# Patient Record
Sex: Female | Born: 1935 | Race: White | Hispanic: No | Marital: Single | Smoking: Former smoker
Health system: Southern US, Community
[De-identification: ages and names within clinical notes are randomized; demographics above are authoritative.]

## PROBLEM LIST (undated history)

## (undated) DIAGNOSIS — G43909 Migraine, unspecified, not intractable, without status migrainosus: Secondary | ICD-10-CM

## (undated) DIAGNOSIS — Z8709 Personal history of other diseases of the respiratory system: Secondary | ICD-10-CM

## (undated) DIAGNOSIS — R011 Cardiac murmur, unspecified: Secondary | ICD-10-CM

## (undated) DIAGNOSIS — E7800 Pure hypercholesterolemia, unspecified: Secondary | ICD-10-CM

## (undated) DIAGNOSIS — I10 Essential (primary) hypertension: Secondary | ICD-10-CM

## (undated) DIAGNOSIS — S72002A Fracture of unspecified part of neck of left femur, initial encounter for closed fracture: Secondary | ICD-10-CM

## (undated) DIAGNOSIS — Z8639 Personal history of other endocrine, nutritional and metabolic disease: Secondary | ICD-10-CM

## (undated) DIAGNOSIS — Z8719 Personal history of other diseases of the digestive system: Secondary | ICD-10-CM

## (undated) HISTORY — DX: Personal history of other diseases of the respiratory system: Z87.09

## (undated) HISTORY — DX: Essential (primary) hypertension: I10

## (undated) HISTORY — PX: APPENDECTOMY: SHX54

## (undated) HISTORY — PX: OTHER SURGICAL HISTORY: SHX169

## (undated) HISTORY — DX: Personal history of other diseases of the digestive system: Z87.19

## (undated) HISTORY — DX: Personal history of other endocrine, nutritional and metabolic disease: Z86.39

## (undated) HISTORY — PX: CHOLECYSTECTOMY: SHX55

## (undated) HISTORY — PX: TONSILLECTOMY: SUR1361

## (undated) HISTORY — PX: LEG SURGERY: SHX1003

## (undated) HISTORY — PX: EYE SURGERY: SHX253

## (undated) HISTORY — DX: Fracture of unspecified part of neck of left femur, initial encounter for closed fracture: S72.002A

---

## 1966-06-25 HISTORY — PX: THORACOTOMY: SHX5074

## 1986-06-25 HISTORY — PX: HAND SURGERY: SHX662

## 1999-06-15 ENCOUNTER — Other Ambulatory Visit: Admission: RE | Admit: 1999-06-15 | Discharge: 1999-06-15 | Payer: Self-pay | Admitting: Internal Medicine

## 2000-06-14 ENCOUNTER — Other Ambulatory Visit: Admission: RE | Admit: 2000-06-14 | Discharge: 2000-06-14 | Payer: Self-pay | Admitting: Internal Medicine

## 2001-07-05 ENCOUNTER — Other Ambulatory Visit: Admission: RE | Admit: 2001-07-05 | Discharge: 2001-07-05 | Payer: Self-pay | Admitting: Internal Medicine

## 2002-06-30 ENCOUNTER — Other Ambulatory Visit: Admission: RE | Admit: 2002-06-30 | Discharge: 2002-06-30 | Payer: Self-pay | Admitting: Internal Medicine

## 2003-07-06 ENCOUNTER — Other Ambulatory Visit: Admission: RE | Admit: 2003-07-06 | Discharge: 2003-07-06 | Payer: Self-pay | Admitting: Internal Medicine

## 2003-07-06 ENCOUNTER — Encounter: Admission: RE | Admit: 2003-07-06 | Discharge: 2003-07-06 | Payer: Self-pay | Admitting: Internal Medicine

## 2003-07-27 ENCOUNTER — Encounter: Admission: RE | Admit: 2003-07-27 | Discharge: 2003-07-27 | Payer: Self-pay | Admitting: Internal Medicine

## 2011-11-29 ENCOUNTER — Emergency Department (HOSPITAL_BASED_OUTPATIENT_CLINIC_OR_DEPARTMENT_OTHER): Payer: Medicare Other

## 2011-11-29 ENCOUNTER — Encounter (HOSPITAL_BASED_OUTPATIENT_CLINIC_OR_DEPARTMENT_OTHER): Payer: Self-pay | Admitting: *Deleted

## 2011-11-29 ENCOUNTER — Emergency Department (HOSPITAL_BASED_OUTPATIENT_CLINIC_OR_DEPARTMENT_OTHER)
Admission: EM | Admit: 2011-11-29 | Discharge: 2011-11-30 | Disposition: A | Discharge status: Home / Self Care | Payer: Medicare Other | Attending: Emergency Medicine | Admitting: Emergency Medicine

## 2011-11-29 DIAGNOSIS — G43909 Migraine, unspecified, not intractable, without status migrainosus: Secondary | ICD-10-CM | POA: Insufficient documentation

## 2011-11-29 DIAGNOSIS — E7800 Pure hypercholesterolemia, unspecified: Secondary | ICD-10-CM | POA: Insufficient documentation

## 2011-11-29 DIAGNOSIS — Z79899 Other long term (current) drug therapy: Secondary | ICD-10-CM | POA: Insufficient documentation

## 2011-11-29 DIAGNOSIS — S42009A Fracture of unspecified part of unspecified clavicle, initial encounter for closed fracture: Secondary | ICD-10-CM

## 2011-11-29 DIAGNOSIS — S42402A Unspecified fracture of lower end of left humerus, initial encounter for closed fracture: Secondary | ICD-10-CM

## 2011-11-29 DIAGNOSIS — W07XXXA Fall from chair, initial encounter: Secondary | ICD-10-CM | POA: Insufficient documentation

## 2011-11-29 DIAGNOSIS — S42033A Displaced fracture of lateral end of unspecified clavicle, initial encounter for closed fracture: Secondary | ICD-10-CM | POA: Insufficient documentation

## 2011-11-29 DIAGNOSIS — F17200 Nicotine dependence, unspecified, uncomplicated: Secondary | ICD-10-CM | POA: Insufficient documentation

## 2011-11-29 DIAGNOSIS — S42409A Unspecified fracture of lower end of unspecified humerus, initial encounter for closed fracture: Secondary | ICD-10-CM | POA: Insufficient documentation

## 2011-11-29 HISTORY — DX: Pure hypercholesterolemia, unspecified: E78.00

## 2011-11-29 HISTORY — DX: Migraine, unspecified, not intractable, without status migrainosus: G43.909

## 2011-11-29 MED ORDER — OXYCODONE-ACETAMINOPHEN 5-325 MG PO TABS
2.0000 | ORAL_TABLET | Freq: Once | ORAL | Status: AC
  Administered 2011-11-29: 2 via ORAL
  Filled 2011-11-29: qty 2

## 2011-11-29 NOTE — ED Notes (Signed)
Larey Seat off a 2 step stool. Hit a cabinet with the left side of her neck and left elbow. EMS was called to the scene but pts family drove her here.

## 2011-11-30 ENCOUNTER — Other Ambulatory Visit: Payer: Self-pay | Admitting: Orthopedic Surgery

## 2011-11-30 MED ORDER — OXYCODONE-ACETAMINOPHEN 5-325 MG PO TABS: 1.0000 | ORAL_TABLET | Freq: Four times a day (QID) | ORAL | Status: AC | PRN

## 2011-11-30 NOTE — ED Notes (Signed)
Splinting applied; patient reports pain in the shoulder area; none on the rest of her arm.  Patient able to move fingers and cap refill present pre and post splinting.

## 2011-11-30 NOTE — Discharge Instructions (Signed)
Cast or Splint Care Casts and splints support injured limbs and keep bones from moving while they heal.  HOME CARE  Keep the cast or splint uncovered during the drying period.   A plaster cast can take 24 to 48 hours to dry.   A fiberglass cast will dry in less than 1 hour.   Do not rest the cast on anything harder than a pillow for 24 hours.   Do not put weight on your injured limb. Do not put pressure on the cast. Wait for your doctor's approval.   Keep the cast or splint dry.   Cover the cast or splint with a plastic bag during baths or wet weather.   If you have a cast over your chest and belly (trunk), take sponge baths until the cast is taken off.   Keep your cast or splint clean. Wash a dirty cast with a damp cloth.   Do not put any objects under your cast or splint. Do not scratch the skin under the cast with an object.   Do not take out the padding from inside your cast.   Exercise your joints near the cast as told by your doctor.   Raise (elevate) your injured limb on 1 or 2 pillows for the first 1 to 3 days.  GET HELP RIGHT AWAY IF:  Your cast or splint cracks.   Your cast or splint is too tight or too loose.   You itch badly under the cast.   Your cast gets wet or has a soft spot.   You have a bad smell coming from the cast.   You get an object stuck under the cast.   Your skin around the cast becomes red or raw.   You have new or more pain after the cast is put on.   You have fluid leaking through the cast.   You cannot move your fingers or toes.   Your fingers or toes turn colors or are cool, painful, or puffy (swollen).   You have tingling or lose feeling (numbness) around the injured area.   You have pain or pressure under the cast.   You have trouble breathing or have shortness of breath.   You have chest pain.  MAKE SURE YOU:  Understand these instructions.   Will watch your condition.   Will get help right away if you are not doing  well or get worse.  Document Released: 10/11/2010 Document Revised: 05/31/2011 Document Reviewed: 10/11/2010 [facility] Patient Information 2012 [place], [place].Clavicle Fracture A clavicle fracture is a break in the collarbone. This is a common injury, especially in children. Collarbones do not harden until around the age of 57. Most collarbone fractures are treated with a simple arm sling. In some cases a figure-of-eight splint is used to help hold the broken bones in position. Although not often needed, surgery may be required if the bone fragments are not in the correct position (displaced).  HOME CARE INSTRUCTIONS   Apply ice to the injury for 15 to 20 minutes each hour while awake for 2 days. Put the ice in a plastic bag and place a towel between the bag of ice and your skin.   Wear the sling or splint constantly for as long as directed by your caregiver. You may remove the sling or splint for bathing or showering. Be sure to keep your shoulder in the same place as when the sling or splint is on. Do not lift your arm.  If a figure-of-eight splint is applied, it must be tightened by another person every day. Tighten it enough to keep the shoulders held back. Allow enough room to place the index finger between the body and strap. Loosen the splint immediately if you feel numbness or tingling in your hands.   Only take over-the-counter or prescription medicines for pain, discomfort, or fever as directed by your caregiver.   Avoid activities that irritate or increase the pain for 4 to 6 weeks after surgery.   Follow all instructions for follow-up with your caregiver. This includes any referrals, physical therapy, and rehabilitation. Any delay in obtaining necessary care could result in a delay or failure of the injury to heal properly.  SEEK MEDICAL CARE IF:  You have pain and swelling that are not relieved with medications. SEEK IMMEDIATE MEDICAL CARE IF:  Your arm is numb, cold, or pale, even  when the splint is loose. MAKE SURE YOU:   Understand these instructions.   Will watch your condition.   Will get help right away if you are not doing well or get worse.  Document Released: 03/21/2005 Document Revised: 05/31/2011 Document Reviewed: 01/15/2008 [facility] Patient Information 2012 [place], [place].

## 2011-11-30 NOTE — ED Provider Notes (Signed)
History     CSN: [id number]  Arrival date & time 11/29/11  2200   First MD Initiated Contact with Patient 11/29/11 2303      Chief Complaint  Patient presents with  . Fall    (Consider location/radiation/quality/duration/timing/severity/associated sxs/prior treatment) Patient is a 76 y.o. female presenting with fall. The history is provided by the patient. No language interpreter was used.  Fall The accident occurred 1 to 2 hours ago. The fall occurred from a stool. She fell from a height of 1 to 2 ft. Impact surface: cabinet. There was no blood loss. The point of impact was the left shoulder and left elbow. The pain is present in the left shoulder and left elbow. The pain is at a severity of 10/10. The pain is severe. She was ambulatory at the scene. There was no entrapment after the fall. There was no drug use involved in the accident. There was no alcohol use involved in the accident. Pertinent negatives include no visual change, no numbness, no abdominal pain, no bowel incontinence, no nausea, no vomiting, no headaches, no loss of consciousness and no tingling. The symptoms are aggravated by activity. Prehospitalization: none. She has tried nothing for the symptoms. The treatment provided no relief.  Did not strike head, no LOC.  No lower exteremity or back pain.    Past Medical History  Diagnosis Date  . Migraine   . High cholesterol     Past Surgical History  Procedure Date  . Leg surgery   . Cholecystectomy   . Thoracotomy   . Hand surgery   . Appendectomy   . Tonsillectomy     No family history on file.  History  Substance Use Topics  . Smoking status: Current Everyday Smoker -- 1.0 packs/day  . Smokeless tobacco: Not on file  . Alcohol Use: No    OB History    Grav Para Term Preterm Abortions TAB SAB Ect Mult Living                  Review of Systems  Gastrointestinal: Negative for nausea, vomiting, abdominal pain and bowel incontinence.  Musculoskeletal:  Positive for arthralgias.  Neurological: Negative for tingling, loss of consciousness, numbness and headaches.  All other systems reviewed and are negative.    Allergies  Review of patient's allergies indicates no known allergies.  Home Medications   Current Outpatient Rx  Name Route Sig Dispense Refill  . BUTALBITAL-APAP-CAFFEINE 50-325-40 MG PO TABS Oral Take 1 tablet by mouth 2 (two) times daily as needed. For migraines    . NAPROXEN SODIUM 220 MG PO TABS Oral Take 440 mg by mouth 2 (two) times daily as needed. For pain    . NORTRIPTYLINE HCL 10 MG PO CAPS Oral Take 40 mg by mouth at bedtime.    Marland Kitchen SIMVASTATIN 20 MG PO TABS Oral Take 20 mg by mouth every evening.    Marland Kitchen VITAMIN D (ERGOCALCIFEROL) PO Oral Take 1 tablet by mouth daily.    Marland Kitchen ZOLMITRIPTAN 5 MG PO TBDP Oral Take 5 mg by mouth as needed. For migraine, may repeat in 2 hours if needed      There were no vitals taken for this visit.  Physical Exam  Constitutional: She is oriented to person, place, and time. She appears well-developed and well-nourished. No distress.  HENT:  Head: Normocephalic and atraumatic.  Right Ear: No mastoid tenderness. No hemotympanum.  Left Ear: No mastoid tenderness. No hemotympanum.  Mouth/Throat: Oropharynx is clear and  moist.  Eyes: Conjunctivae and EOM are normal. Pupils are equal, round, and reactive to light.  Neck: Normal range of motion. Neck supple.  Cardiovascular: Normal rate and regular rhythm.   Pulmonary/Chest: Effort normal and breath sounds normal. She has no wheezes. She has no rales.  Abdominal: Soft. Bowel sounds are normal. There is no tenderness. There is no rebound and no guarding.  Musculoskeletal: Normal range of motion.       Left hand neurovascularly intact no snuff box tenderness of the left wrist.  Cap refill to the fingers of the left hand < 2 sec.  Negative anterior and posterior drawer tests of B knees.  No step offs or crepitance of the spine intact L5/s1 intact  perineal sensation  Lymphadenopathy:    She has no cervical adenopathy.  Neurological: She is alert and oriented to person, place, and time. She has normal reflexes. GCS eye subscore is 4. GCS verbal subscore is 5. GCS motor subscore is 6.  Skin: Skin is warm and dry.  Psychiatric: She has a normal mood and affect.    ED Course  Procedures (including critical care time)  Labs Reviewed - No data to display Dg Chest 2 View  11/30/2011  *RADIOLOGY REPORT*  Clinical Data: Larey Seat.  Chest pain.  CHEST - 2 VIEW  Comparison: None available  Findings: The cardiac silhouette, mediastinal and hilar contours are within normal limits.  There are chronic-appearing bronchitic type scarring changes but no definite acute overlying pulmonary process.  No pneumothorax or pleural effusion.  The left distal clavicle fractures noted.  No obvious rib fractures.  The thoracic vertebral bodies are normally aligned.  IMPRESSION:  1.  Chronic appearing scarring type changes without acute overlying pulmonary process. 2.  Distal clavicle fracture on the left.  Original Report Authenticated By: P. Loralie Champagne, M.D.   Dg Cervical Spine Complete  11/30/2011  *RADIOLOGY REPORT*  Clinical Data: Larey Seat.  Neck pain.  CERVICAL SPINE - COMPLETE 4+ VIEW  Comparison: None  Findings: The lateral film demonstrates normal alignment of the cervical vertebral bodies.  Disc spaces and vertebral bodies are maintained.  No acute bony findings or abnormal prevertebral soft tissue swelling.  The oblique films demonstrate normally aligned articular facets and patent neural foramen.  The C1-C2 articulations are maintained. The lung apices are clear.  IMPRESSION: Normal alignment and no acute bony findings.  Original Report Authenticated By: P. Loralie Champagne, M.D.   Dg Clavicle Left  11/29/2011  *RADIOLOGY REPORT*  Clinical Data: History of fall complaining of left shoulder pain.  LEFT CLAVICLE - 2+ VIEWS  Comparison: No priors.  Findings: Two views of  the left clavicle demonstrate a mildly comminuted fracture of the distal third of the left clavicle.  The acromioclavicular joint appears intact.  IMPRESSION: 1. Mildly comminuted fracture of the distal third of the left clavicle.  Original Report Authenticated By: Florencia Reasons, M.D.   Dg Scapula Left  11/30/2011  *RADIOLOGY REPORT*  Clinical Data: Larey Seat.  Left shoulder pain.  LEFT SCAPULA - 2+ VIEWS  Comparison: None  Findings: There is a nondisplaced fracture of the distal clavicle. The [facility] joint is intact.  The glenohumeral joint is maintained.  No obvious rib fractures on the left.  The scapula is intact.  IMPRESSION:  1.  Nondisplaced distal clavicle fracture. 2.  No scapular fracture.  Original Report Authenticated By: P. Loralie Champagne, M.D.   Dg Elbow Complete Left  11/29/2011  *RADIOLOGY REPORT*  Clinical Data: History of  fall complaining of left elbow pain.  LEFT ELBOW - COMPLETE 3+ VIEW  Comparison: No priors.  Findings: There is an oblique fracture which extends through the lateral epicondyle all the way to the articular surface in the region of the capitellum.  This is mildly displaced superiorly(approximately 4 mm laterally), but does not exhibit significant displacement at the level of the articular surface. The proximal radius and ulna appear intact.  Overlying soft tissues are markedly swollen.  IMPRESSION: 1.  Acute oblique intra-articular fracture of the distal humerus extending from the lateral epicondyle through the capitellum, as above.  Original Report Authenticated By: Florencia Reasons, M.D.   Dg Humerus Left  11/30/2011  *RADIOLOGY REPORT*  Clinical Data: Larey Seat.  Left arm pain.  LEFT HUMERUS - 2+ VIEW  Comparison: None  Findings: The shoulder and elbow joints are maintained.  No fracture of the humerus is identified.  The distal clavicle fractures noted.  IMPRESSION:  1.  No acute fracture of the humerus. 2.  Distal left clavicle fracture.  Original Report Authenticated By: P.  Loralie Champagne, M.D.     No diagnosis found.    MDM  Case d/w Dr. Thomasena Edis, call hand surgery for elbow Case d/w Dr Merlyn Lot splint and call office in am for elbow, clavicle to general orthopedics  Patient instructed to ice and elevate arm and call in am for follow up with orthopedics and hand surgery.  Verbalizes understanding and agrees to follow up        Deloros Beretta Smitty Cords, MD 11/30/11 [phone]

## 2011-12-03 ENCOUNTER — Encounter (HOSPITAL_BASED_OUTPATIENT_CLINIC_OR_DEPARTMENT_OTHER): Payer: Self-pay | Admitting: *Deleted

## 2011-12-03 NOTE — Progress Notes (Signed)
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

## 2011-12-04 ENCOUNTER — Encounter (HOSPITAL_BASED_OUTPATIENT_CLINIC_OR_DEPARTMENT_OTHER): Payer: Self-pay | Admitting: Certified Registered Nurse Anesthetist

## 2011-12-04 ENCOUNTER — Ambulatory Visit (HOSPITAL_BASED_OUTPATIENT_CLINIC_OR_DEPARTMENT_OTHER): Payer: Medicare Other | Admitting: Certified Registered Nurse Anesthetist

## 2011-12-04 ENCOUNTER — Ambulatory Visit (HOSPITAL_BASED_OUTPATIENT_CLINIC_OR_DEPARTMENT_OTHER)
Admission: RE | Admit: 2011-12-04 | Discharge: 2011-12-04 | Disposition: A | Discharge status: Home / Self Care | Payer: Medicare Other | Source: Ambulatory Visit | Attending: Orthopedic Surgery | Admitting: Orthopedic Surgery

## 2011-12-04 ENCOUNTER — Encounter (HOSPITAL_BASED_OUTPATIENT_CLINIC_OR_DEPARTMENT_OTHER)
Admission: RE | Disposition: A | Discharge status: Home / Self Care | Payer: Self-pay | Source: Ambulatory Visit | Attending: Orthopedic Surgery

## 2011-12-04 ENCOUNTER — Encounter (HOSPITAL_BASED_OUTPATIENT_CLINIC_OR_DEPARTMENT_OTHER): Payer: Self-pay | Admitting: Orthopedic Surgery

## 2011-12-04 DIAGNOSIS — IMO0002 Reserved for concepts with insufficient information to code with codable children: Secondary | ICD-10-CM | POA: Insufficient documentation

## 2011-12-04 DIAGNOSIS — E7800 Pure hypercholesterolemia, unspecified: Secondary | ICD-10-CM | POA: Insufficient documentation

## 2011-12-04 DIAGNOSIS — Y998 Other external cause status: Secondary | ICD-10-CM | POA: Insufficient documentation

## 2011-12-04 DIAGNOSIS — G43909 Migraine, unspecified, not intractable, without status migrainosus: Secondary | ICD-10-CM | POA: Insufficient documentation

## 2011-12-04 DIAGNOSIS — S42453A Displaced fracture of lateral condyle of unspecified humerus, initial encounter for closed fracture: Secondary | ICD-10-CM | POA: Insufficient documentation

## 2011-12-04 DIAGNOSIS — R011 Cardiac murmur, unspecified: Secondary | ICD-10-CM | POA: Insufficient documentation

## 2011-12-04 DIAGNOSIS — Y92009 Unspecified place in unspecified non-institutional (private) residence as the place of occurrence of the external cause: Secondary | ICD-10-CM | POA: Insufficient documentation

## 2011-12-04 HISTORY — PX: ORIF HUMERUS FRACTURE: SHX2126

## 2011-12-04 HISTORY — DX: Cardiac murmur, unspecified: R01.1

## 2011-12-04 SURGERY — OPEN REDUCTION INTERNAL FIXATION (ORIF) DISTAL HUMERUS FRACTURE
Anesthesia: Choice | Site: Elbow | Laterality: Left | Wound class: Clean

## 2011-12-04 MED ORDER — ROPIVACAINE HCL 5 MG/ML IJ SOLN
INTRAMUSCULAR | Status: DC | PRN
  Administered 2011-12-04: 20 mL via EPIDURAL

## 2011-12-04 MED ORDER — PHENYLEPHRINE HCL 10 MG/ML IJ SOLN
10.0000 mg | INTRAVENOUS | Status: DC | PRN
  Administered 2011-12-04: 50 ug/min via INTRAVENOUS

## 2011-12-04 MED ORDER — LACTATED RINGERS IV SOLN
INTRAVENOUS | Status: DC
  Administered 2011-12-04 (×4): via INTRAVENOUS

## 2011-12-04 MED ORDER — ONDANSETRON HCL 4 MG/2ML IJ SOLN
INTRAMUSCULAR | Status: DC | PRN
  Administered 2011-12-04 (×2): 4 mg via INTRAVENOUS

## 2011-12-04 MED ORDER — LIDOCAINE-EPINEPHRINE 1.5-1:200000 % IJ SOLN
INTRAMUSCULAR | Status: DC | PRN
  Administered 2011-12-04: 20 mL via INTRADERMAL

## 2011-12-04 MED ORDER — EPHEDRINE SULFATE 50 MG/ML IJ SOLN
INTRAMUSCULAR | Status: DC | PRN
  Administered 2011-12-04 (×3): 10 mg via INTRAVENOUS

## 2011-12-04 MED ORDER — FENTANYL CITRATE 0.05 MG/ML IJ SOLN
50.0000 ug | INTRAMUSCULAR | Status: DC | PRN
  Administered 2011-12-04: 100 ug via INTRAVENOUS

## 2011-12-04 MED ORDER — DEXAMETHASONE SODIUM PHOSPHATE 10 MG/ML IJ SOLN
INTRAMUSCULAR | Status: DC | PRN
  Administered 2011-12-04: 10 mg via INTRAVENOUS

## 2011-12-04 MED ORDER — CEFAZOLIN SODIUM 1-5 GM-% IV SOLN
1.0000 g | INTRAVENOUS | Status: AC
  Administered 2011-12-04: 2 g via INTRAVENOUS

## 2011-12-04 MED ORDER — CHLORHEXIDINE GLUCONATE 4 % EX LIQD: 60.0000 mL | Freq: Once | CUTANEOUS | Status: DC

## 2011-12-04 MED ORDER — MIDAZOLAM HCL 2 MG/2ML IJ SOLN
0.5000 mg | INTRAMUSCULAR | Status: DC | PRN
  Administered 2011-12-04 (×2): 1 mg via INTRAVENOUS

## 2011-12-04 MED ORDER — METOCLOPRAMIDE HCL 5 MG/ML IJ SOLN: 10.0000 mg | Freq: Once | INTRAMUSCULAR | Status: DC | PRN

## 2011-12-04 MED ORDER — LIDOCAINE HCL 1 % IJ SOLN
INTRAMUSCULAR | Status: DC | PRN
  Administered 2011-12-04: 2 mL via INTRADERMAL

## 2011-12-04 MED ORDER — OXYCODONE HCL 5 MG PO TABS: 5.0000 mg | ORAL_TABLET | Freq: Once | ORAL | Status: DC | PRN

## 2011-12-04 MED ORDER — CEPHALEXIN 500 MG PO CAPS: 500.0000 mg | ORAL_CAPSULE | Freq: Three times a day (TID) | ORAL | Status: AC

## 2011-12-04 MED ORDER — FENTANYL CITRATE 0.05 MG/ML IJ SOLN: 25.0000 ug | INTRAMUSCULAR | Status: DC | PRN

## 2011-12-04 MED ORDER — PROPOFOL 10 MG/ML IV EMUL
INTRAVENOUS | Status: DC | PRN
  Administered 2011-12-04: 180 mg via INTRAVENOUS

## 2011-12-04 MED ORDER — LIDOCAINE HCL (CARDIAC) 20 MG/ML IV SOLN
INTRAVENOUS | Status: DC | PRN
  Administered 2011-12-04: 30 mg via INTRAVENOUS

## 2011-12-04 SURGICAL SUPPLY — 69 items
BANDAGE CONFORM 3  STR LF (GAUZE/BANDAGES/DRESSINGS) IMPLANT
BANDAGE ELASTIC 3 VELCRO ST LF (GAUZE/BANDAGES/DRESSINGS) ×4 IMPLANT
BANDAGE GAUZE ELAST BULKY 4 IN (GAUZE/BANDAGES/DRESSINGS) ×4 IMPLANT
BIT DRILL 2.8 QUICK RELEASE (BIT) ×1 IMPLANT
BLADE MINI RND TIP GREEN BEAV (BLADE) IMPLANT
BLADE SURG 15 STRL LF DISP TIS (BLADE) ×2 IMPLANT
BLADE SURG 15 STRL SS (BLADE) ×2
BNDG ESMARK 4X9 LF (GAUZE/BANDAGES/DRESSINGS) ×2 IMPLANT
BNDG PLASTER X FAST 4X5 WHT LF (CAST SUPPLIES) IMPLANT
CHLORAPREP W/TINT 26ML (MISCELLANEOUS) ×2 IMPLANT
CLOTH BEACON ORANGE TIMEOUT ST (SAFETY) ×2 IMPLANT
CORDS BIPOLAR (ELECTRODE) ×2 IMPLANT
COVER MAYO STAND STRL (DRAPES) ×2 IMPLANT
COVER TABLE BACK 60X90 (DRAPES) ×2 IMPLANT
DRAPE EXTREMITY T 121X128X90 (DRAPE) ×2 IMPLANT
DRAPE OEC MINIVIEW 54X84 (DRAPES) ×2 IMPLANT
DRAPE SURG 17X23 STRL (DRAPES) ×2 IMPLANT
DRILL 2.8 QUICK RELEASE (BIT) ×2
DRSG PAD ABDOMINAL 8X10 ST (GAUZE/BANDAGES/DRESSINGS) ×2 IMPLANT
GAUZE XEROFORM 1X8 LF (GAUZE/BANDAGES/DRESSINGS) ×2 IMPLANT
GLOVE BIO SURGEON STRL SZ7.5 (GLOVE) ×2 IMPLANT
GLOVE BIOGEL PI IND STRL 8 (GLOVE) ×1 IMPLANT
GLOVE BIOGEL PI IND STRL 8.5 (GLOVE) ×1 IMPLANT
GLOVE BIOGEL PI INDICATOR 8 (GLOVE) ×1
GLOVE BIOGEL PI INDICATOR 8.5 (GLOVE) ×1
GLOVE SURG ORTHO 8.0 STRL STRW (GLOVE) ×2 IMPLANT
GOWN PREVENTION PLUS XLARGE (GOWN DISPOSABLE) ×2 IMPLANT
GOWN PREVENTION PLUS XXLARGE (GOWN DISPOSABLE) IMPLANT
GOWN STRL REIN XL XLG (GOWN DISPOSABLE) ×4 IMPLANT
GUIDEWIRE ORTH 6X062XTROC NS (WIRE) ×1 IMPLANT
GUIDEWIRE ORTHO MINI ACTK .045 (WIRE) ×4 IMPLANT
K-WIRE .062 (WIRE) ×1
NEEDLE FILTER BLUNT 18X 1/2SAF (NEEDLE)
NEEDLE FILTER BLUNT 18X1 1/2 (NEEDLE) IMPLANT
NEEDLE HYPO 22GX1.5 SAFETY (NEEDLE) IMPLANT
NEEDLE HYPO 25X1 1.5 SAFETY (NEEDLE) IMPLANT
NS IRRIG 1000ML POUR BTL (IV SOLUTION) ×2 IMPLANT
PACK BASIN DAY SURGERY FS (CUSTOM PROCEDURE TRAY) ×2 IMPLANT
PAD CAST 3X4 CTTN HI CHSV (CAST SUPPLIES) IMPLANT
PAD CAST 4YDX4 CTTN HI CHSV (CAST SUPPLIES) ×1 IMPLANT
PADDING CAST ABS 4INX4YD NS (CAST SUPPLIES)
PADDING CAST ABS COTTON 4X4 ST (CAST SUPPLIES) IMPLANT
PADDING CAST COTTON 3X4 STRL (CAST SUPPLIES)
PADDING CAST COTTON 4X4 STRL (CAST SUPPLIES) ×1
PLATE LATERAL LOCK 6 HOLE (Plate) ×2 IMPLANT
SCREW CORTICAL 3.5X20MM (Screw) ×2 IMPLANT
SCREW HEX NON LOCK 3.5X32MM (Screw) ×2 IMPLANT
SCREW HEX NON LOCK 3.5X34MM (Screw) ×2 IMPLANT
SCREW NON LOCKING HEX 3.5X24 (Screw) ×2 IMPLANT
SCREW NON LOCKING HEX 3.5X50MM (Screw) ×4 IMPLANT
SLEEVE SCD COMPRESS KNEE MED (MISCELLANEOUS) ×2 IMPLANT
SPLINT PLASTER CAST XFAST 3X15 (CAST SUPPLIES) ×30 IMPLANT
SPLINT PLASTER CAST XFAST 4X15 (CAST SUPPLIES) IMPLANT
SPLINT PLASTER XTRA FAST SET 4 (CAST SUPPLIES)
SPLINT PLASTER XTRA FASTSET 3X (CAST SUPPLIES) ×30
SPONGE GAUZE 4X4 12PLY (GAUZE/BANDAGES/DRESSINGS) ×2 IMPLANT
STOCKINETTE 4X48 STRL (DRAPES) ×2 IMPLANT
SUCTION FRAZIER TIP 10 FR DISP (SUCTIONS) IMPLANT
SUT ETHILON 3 0 PS 1 (SUTURE) ×4 IMPLANT
SUT ETHILON 4 0 PS 2 18 (SUTURE) ×2 IMPLANT
SUT VIC AB 2-0 SH 27 (SUTURE) ×1
SUT VIC AB 2-0 SH 27XBRD (SUTURE) ×1 IMPLANT
SUT VICRYL 4-0 PS2 18IN ABS (SUTURE) ×2 IMPLANT
SYR BULB 3OZ (MISCELLANEOUS) ×2 IMPLANT
SYR CONTROL 10ML LL (SYRINGE) IMPLANT
TOWEL OR 17X24 6PK STRL BLUE (TOWEL DISPOSABLE) ×4 IMPLANT
TUBE CONNECTING 20X1/4 (TUBING) IMPLANT
UNDERPAD 30X30 INCONTINENT (UNDERPADS AND DIAPERS) ×2 IMPLANT
WATER STERILE IRR 1000ML POUR (IV SOLUTION) IMPLANT

## 2011-12-04 NOTE — Anesthesia Procedure Notes (Addendum)
Procedure Name: LMA Insertion Date/Time: 12/04/2011 12:15 PM Performed by: BLOCKER, TIMOTHY D Pre-anesthesia Checklist: Patient identified, Emergency Drugs available, Suction available and Patient being monitored Patient Re-evaluated:Patient Re-evaluated prior to inductionOxygen Delivery Method: Circle System Utilized Preoxygenation: Pre-oxygenation with 100% oxygen Intubation Type: IV induction Ventilation: Mask ventilation without difficulty LMA: LMA inserted LMA Size: 4.0 Number of attempts: 1 Placement Confirmation: positive ETCO2 Tube secured with: Tape Dental Injury: Teeth and Oropharynx as per pre-operative assessment    Anesthesia Regional Block:  Supraclavicular block  Pre-Anesthetic Checklist: ,, timeout performed, Correct Patient, Correct Site, Correct Laterality, Correct Procedure, Correct Position, site marked, Risks and benefits discussed,  Surgical consent,  Pre-op evaluation,  At surgeon's request and post-op pain management  Laterality: Left  Prep: chloraprep       Needles:   Needle Type: Other   (Arrow Echogenic)   Needle Length: 9cm  Needle Gauge: 21    Additional Needles:  Procedures: ultrasound guided Supraclavicular block Narrative:  Start time: 12/04/2011 11:45 AM End time: 12/04/2011 11:53 AM Injection made incrementally with aspirations every 5 mL.  Performed by: Personally  Anesthesiologist: C Veronica Guerrant  Additional Notes: Ultrasound guidance used to: id relevant anatomy, confirm needle position, local anesthetic spread, avoidance of vascular puncture. Picture saved. No complications. Block performed personally by Janetta Hora. Gelene Mink, MD    Supraclavicular block

## 2011-12-04 NOTE — H&P (Signed)
  Jamie Shepard is an 76 y.o. female.   Chief Complaint: left elbow fracture HPI: 76 yo rhd female fell into cabinet 11/29/11.  Seen at [facility] where XR revealed left lateral condyle fracture.  Also with left clavicle fracture.  Previous RSD of bilateral upper extremities.  States she did not have full motion of forearm prior to injury.  Past Medical History  Diagnosis Date  . Migraine   . High cholesterol   . Heart murmur     did have echo 2010-[facility]    Past Surgical History  Procedure Date  . Leg surgery     fx rt femer and hip  . Cholecystectomy   . Thoracotomy 1968    spont pneumthorax-both sides-lt side fixed  . Hand surgery 1988    trigger finger lt habd   . Appendectomy   . Tonsillectomy   . Eye surgery     cataracts    No family history on file. Social History:  reports that she has been smoking.  She does not have any smokeless tobacco history on file. She reports that she does not drink alcohol or use illicit drugs.  Allergies: No Known Allergies  Medications Prior to Admission  Medication Sig Dispense Refill  . butalbital-acetaminophen-caffeine (FIORICET, ESGIC) 50-325-40 MG per tablet Take 1 tablet by mouth 2 (two) times daily as needed. For migraines      . HYDROcodone-acetaminophen (NORCO) 5-325 MG per tablet Take 1 tablet by mouth every 6 (six) hours as needed.      . naproxen sodium (ANAPROX) 220 MG tablet Take 440 mg by mouth 2 (two) times daily as needed. For pain      . nortriptyline (PAMELOR) 10 MG capsule Take 40 mg by mouth at bedtime.      Marland Kitchen VITAMIN D, ERGOCALCIFEROL, PO Take 1 tablet by mouth daily.      Marland Kitchen zolmitriptan (ZOMIG-ZMT) 5 MG disintegrating tablet Take 5 mg by mouth as needed. For migraine, may repeat in 2 hours if needed      . oxyCODONE-acetaminophen (PERCOCET) 5-325 MG per tablet Take 1 tablet by mouth every 6 (six) hours as needed for pain.  13 tablet  0  . simvastatin (ZOCOR) 20 MG tablet Take 20 mg by mouth every evening.        No  results found for this or any previous visit (from the past 48 hour(s)).  No results found.   A comprehensive review of systems was negative except for: Eyes: positive for cataracts and contacts/glasses Neurological: positive for headaches  Height 5' 6.5" (1.689 m), weight 81.647 kg (180 lb).  General appearance: alert, cooperative and appears stated age Head: Normocephalic, without obvious abnormality, atraumatic Neck: supple, symmetrical, trachea midline Resp: clear to auscultation bilaterally Cardio: regular rate and rhythm GI: soft, non-tender; bowel sounds normal; no masses,  no organomegaly Extremities: light touch sensation and capillary refill intact all digis.  +epl/fpl/io.  some swelling in digits.  skin intact Pulses: 2+ and symmetric Skin: Skin color, texture, turgor normal. No rashes or lesions Neurologic: Grossly normal Incision/Wound: na  Assessment/Plan Left distal humerus lateral condyle fracture.  Discussed nature of injury and non operative and operative treatment options.  She elected to undergo surgical fixation of the lateral condyle fracture.  Risks, benefits, and alternatives of surgery were discussed and the patient agrees with the plan of care.   Champion Corales R 12/04/2011, 11:15 AM

## 2011-12-04 NOTE — Anesthesia Preprocedure Evaluation (Signed)
Anesthesia Evaluation  Patient identified by MRN, date of birth, ID band Patient awake    Reviewed: Allergy & Precautions, H&P , NPO status , Patient's Chart, lab work & pertinent test results, reviewed documented beta blocker date and time   Airway Mallampati: II TM Distance: >3 FB Neck ROM: full    Dental   Pulmonary neg pulmonary ROS,          Cardiovascular negative cardio ROS  + Valvular Problems/Murmurs     Neuro/Psych  Headaches, negative psych ROS   GI/Hepatic negative GI ROS, Neg liver ROS,   Endo/Other  negative endocrine ROS  Renal/GU negative Renal ROS  negative genitourinary   Musculoskeletal   Abdominal   Peds  Hematology negative hematology ROS (+)   Anesthesia Other Findings See surgeon's H&P   Reproductive/Obstetrics negative OB ROS                           Anesthesia Physical Anesthesia Plan  ASA: II  Anesthesia Plan: General   Post-op Pain Management:    Induction:   Airway Management Planned: LMA  Additional Equipment:   Intra-op Plan:   Post-operative Plan: Extubation in OR  Informed Consent: I have reviewed the patients History and Physical, chart, labs and discussed the procedure including the risks, benefits and alternatives for the proposed anesthesia with the patient or authorized representative who has indicated his/her understanding and acceptance.   Dental Advisory Given  Plan Discussed with: CRNA and Surgeon  Anesthesia Plan Comments:         Anesthesia Quick Evaluation

## 2011-12-04 NOTE — Anesthesia Postprocedure Evaluation (Signed)
Anesthesia Post Note  Patient: Jamie Shepard  Procedure(s) Performed: Procedure(s) (LRB): OPEN REDUCTION INTERNAL FIXATION (ORIF) DISTAL HUMERUS FRACTURE (Left)  Anesthesia type: General  Patient location: PACU  Post pain: Pain level controlled  Post assessment: Patient's Cardiovascular Status Stable  Last Vitals:  Filed Vitals:   12/04/11 1430  BP: 138/70  Pulse: 97  Temp:   Resp: 16    Post vital signs: Reviewed and stable  Level of consciousness: alert  Complications: No apparent anesthesia complications

## 2011-12-04 NOTE — Op Note (Signed)
NAMEEULALA, NEWCOMBE NO.:  [id number]  MEDICAL RECORD NO.:  [id number]  LOCATION:                                 FACILITY:  PHYSICIAN:  Betha Loa, MD        DATE OF BIRTH:  May 07, 1936  DATE OF PROCEDURE:  12/04/2011 DATE OF DISCHARGE:                              OPERATIVE REPORT   PREOPERATIVE DIAGNOSIS:  Left distal humerus lateral condyle fracture.  POSTOPERATIVE DIAGNOSIS:  Left distal humerus lateral condyle fracture.  PROCEDURE:  Open reduction, internal fixation left distal humerus lateral condyle fracture.  SURGEON:  Betha Loa, MD  ASSISTANT:  Cindee Salt, MD  ANESTHESIA:  General with regional.  IV FLUIDS:  Per anesthesia flow sheet.  ESTIMATED BLOOD LOSS:  Minimal.  COMPLICATIONS:  None.  SPECIMENS:  None.  TOURNIQUET TIME:  78 minutes.  DISPOSITION:  Stable to PACU.  INDICATIONS:  Ms. Devivo is a 76 year old female who 5 days ago fell and hit her right arm on her cabinets.  She had pain in the elbow. She was seen at [facility] where radiographs revealed a left distal humerus lateral condyle fracture.  She was placed in posterior splint and followed up with me in the office.  On evaluation, she had intact sensation and capillary refill in all fingertips.  Her skin was intact.  We reviewed the radiographs and discussing showing all the nature of the injury.  We discussed nonoperative and operative treatment options.  She elected to undergo operative fixation for management of the fracture.  Risks, benefits, and alternatives of surgery were discussed including risk of blood loss, infection, damage to nerves, vessels, tendons, ligaments, bone, failure to surgery, need for additional surgery, complications with wound healing, continued pain, nonunion, malunion, stiffness.  She voiced understanding of these risks and elected to proceed.  OPERATIVE COURSE:  After being identified preoperatively by myself, the patient  and I agreed upon procedure and site procedure.  Surgical site was marked.  Risks, benefits, and alternatives of surgery were reviewed. She wished to proceed.  Surgical consent had been signed.  She was given IV Ancef as preoperative antibiotic prophylaxis.  Regional block was performed by anesthesia in preoperative holding.  She was transported to the operating room and placing on the operating room table in supine position with left upper extremity on arm board.  General anesthesia was induced by anesthesiologist.  Care was taken with the left upper extremity due to the existing clavicle fracture.  The left upper extremity was prepped and draped in normal sterile orthopedic fashion.  Surgical pause was performed between surgeons, anesthesia, operating staff, and all were in agreement with the patient, procedure, and site of procedure. Tourniquet to proximal aspect of the extremity was inflated to 250 mmHg after exsanguination of limb and Esmarch bandage.  A Kocher approach was used.  An incision was made in the skin and carried into subcutaneous tissues by spreading technique.  Bipolar electrocautery was used to obtain hemostasis.  The interval between the triceps and the ECRB and ECRL muscles were identified and spread.  The fracture site was easily identified.  There was significant amount of hematoma.  This was cleared out using pickups and saline  flush.  The fracture site was cleaned of any soft tissue interposition.  There was some comminution at the distal aspect of the fracture, at the posterior and inferior aspect of the lateral condyle.  The fracture was reduced under direct visualization and secured using two 0.062 inch guide pins.  C-arm was used in AP, lateral, and oblique projections to ensure appropriate reduction. Reduction was adjusted until appropriate reduction was obtained and confirmed under C-arm guidance.  A lateral plate from the Acumed distal humerus set was  selected.  It was placed on the distal humerus.  Once adequate position had been obtained, standard AO drilling and measuring technique was used to place a nonlocking screws in all the holes of the plate.  Six screws in all were used.  There was good purchase in most of the screws.  Two screws had weaker purchase.  C-arm was used in AP, lateral, and oblique projections to ensure appropriate position of hardware and reduction of fracture which was the case.  The screws were anterior to the olecranon fossa.  The arm was placed through a range of motion and there was no mechanical block to motion.  The articular surface had been restored nicely.  Fixation was stable.  The guide pins had been removed.  The wound was copiously irrigated with sterile saline.  Soft tissues were repaired back over top of the plate using 2-0 Vicryl suture and 2-0 Vicryl suture was used in an inverted interrupted fashion in the subcutaneous tissues.  The skin was closed with 3-0 nylon in a horizontal mattress fashion.  The wound was dressed with sterile Xeroform, 4x4s, and wrapped with a Kerlix bandage.  A dorsal splint with a side bar on the medial side was placed and wrapped with Kerlix and Ace bandage.  Tourniquet was deflated at 78 minutes.  The fingertips were pink with brisk capillary refill after deflation of tourniquet. Operative drapes were broken down.  The patient was awoken from anesthesia safely.  She was transferred back to stretcher and taken to PACU in stable condition.  I will see her back in the office in 1 week for postoperative followup.  She already has prescriptions for both Percocet and hydrocodone at home.  I will also give her Keflex 500 mg p.o. t.i.d. x7 days.     Betha Loa, MD     KK/MEDQ  D:  12/04/2011  T:  12/04/2011  Job:  [id number]

## 2011-12-04 NOTE — Transfer of Care (Signed)
Immediate Anesthesia Transfer of Care Note  Patient: Jamie Shepard  Procedure(s) Performed: Procedure(s) (LRB): OPEN REDUCTION INTERNAL FIXATION (ORIF) DISTAL HUMERUS FRACTURE (Left)  Patient Location: PACU  Anesthesia Type: General and Regional  Level of Consciousness: awake, alert , oriented and patient cooperative  Airway & Oxygen Therapy: Patient Spontanous Breathing and Patient connected to face mask oxygen  Post-op Assessment: Report given to PACU RN and Post -op Vital signs reviewed and stable  Post vital signs: Reviewed and stable  Complications: No apparent anesthesia complications

## 2011-12-04 NOTE — Progress Notes (Signed)
Assisted Dr. Frederick with left, ultrasound guided, supraclavicular block. Side rails up, monitors on throughout procedure. See vital signs in flow sheet. Tolerated Procedure well. 

## 2011-12-04 NOTE — Op Note (Signed)
Dictation [phone]

## 2011-12-04 NOTE — Discharge Instructions (Addendum)
[facility] Instructions °Hand Surgery ° °Wound Care: °Keep your hand elevated above the level of your heart.  Do not allow it to dangle by your side.  Keep the dressing dry and do not remove it unless your doctor advises you to do so.  He will usually change it at the time of your post-op visit.  Moving your fingers is advised to stimulate circulation but will depend on the site of your surgery.  If you have a splint applied, your doctor will advise you regarding movement. ° °Activity: °Do not drive or operate machinery today.  Rest today and then you may return to your normal activity and work as indicated by your physician. ° °Diet:  °Drink liquids today or eat a light diet.  You may resume a regular diet tomorrow.   ° °General expectations: °Pain for two to three days. °Fingers may become slightly swollen. ° °Call your doctor if any of the following occur: °Severe pain not relieved by pain medication. °Elevated temperature. °Dressing soaked with blood. °Inability to move fingers. °White or bluish color to fingers. ° ° °Post Anesthesia Home Care Instructions ° °Activity: °Get plenty of rest for the remainder of the day. A responsible adult should stay with you for 24 hours following the procedure.  °For the next 24 hours, DO NOT: °-Drive a car °-Operate machinery °-Drink alcoholic beverages °-Take any medication unless instructed by your physician °-Make any legal decisions or sign important papers. ° °Meals: °Start with liquid foods such as gelatin or soup. Progress to regular foods as tolerated. Avoid greasy, spicy, heavy foods. If nausea and/or vomiting occur, drink only clear liquids until the nausea and/or vomiting subsides. Call your physician if vomiting continues. ° °Special Instructions/Symptoms: °Your throat may feel dry or sore from the anesthesia or the breathing tube placed in your throat during surgery. If this causes discomfort, gargle with warm salt water. The discomfort should disappear within 24  hours. ° ° °Regional Anesthesia Blocks ° °1. Numbness or the inability to move the "blocked" extremity may last from 3-48 hours after placement. The length of time depends on the medication injected and your individual response to the medication. If the numbness is not going away after 48 hours, call your surgeon. ° °2. The extremity that is blocked will need to be protected until the numbness is gone and the  Strength has returned. Because you cannot feel it, you will need to take extra care to avoid injury. Because it may be weak, you may have difficulty moving it or using it. You may not know what position it is in without looking at it while the block is in effect. ° °3. For blocks in the legs and feet, returning to weight bearing and walking needs to be done carefully. You will need to wait until the numbness is entirely gone and the strength has returned. You should be able to move your leg and foot normally before you try and bear weight or walk. You will need someone to be with you when you first try to ensure you do not fall and possibly risk injury. ° °4. Bruising and tenderness at the needle site are common side effects and will resolve in a few days. ° °5. Persistent numbness or new problems with movement should be communicated to the surgeon or the [facility] ([phone])/ [facility] ([phone]). °

## 2011-12-06 ENCOUNTER — Encounter (HOSPITAL_BASED_OUTPATIENT_CLINIC_OR_DEPARTMENT_OTHER): Payer: Self-pay | Admitting: Orthopedic Surgery

## 2014-03-17 IMAGING — CR DG CHEST 2V
2 series · 2 of 2 positions shown · non-contrast
Comparison: None available

CLINICAL DATA: Fell.  Chest pain.

CHEST - 2 VIEW

[w chest pa]
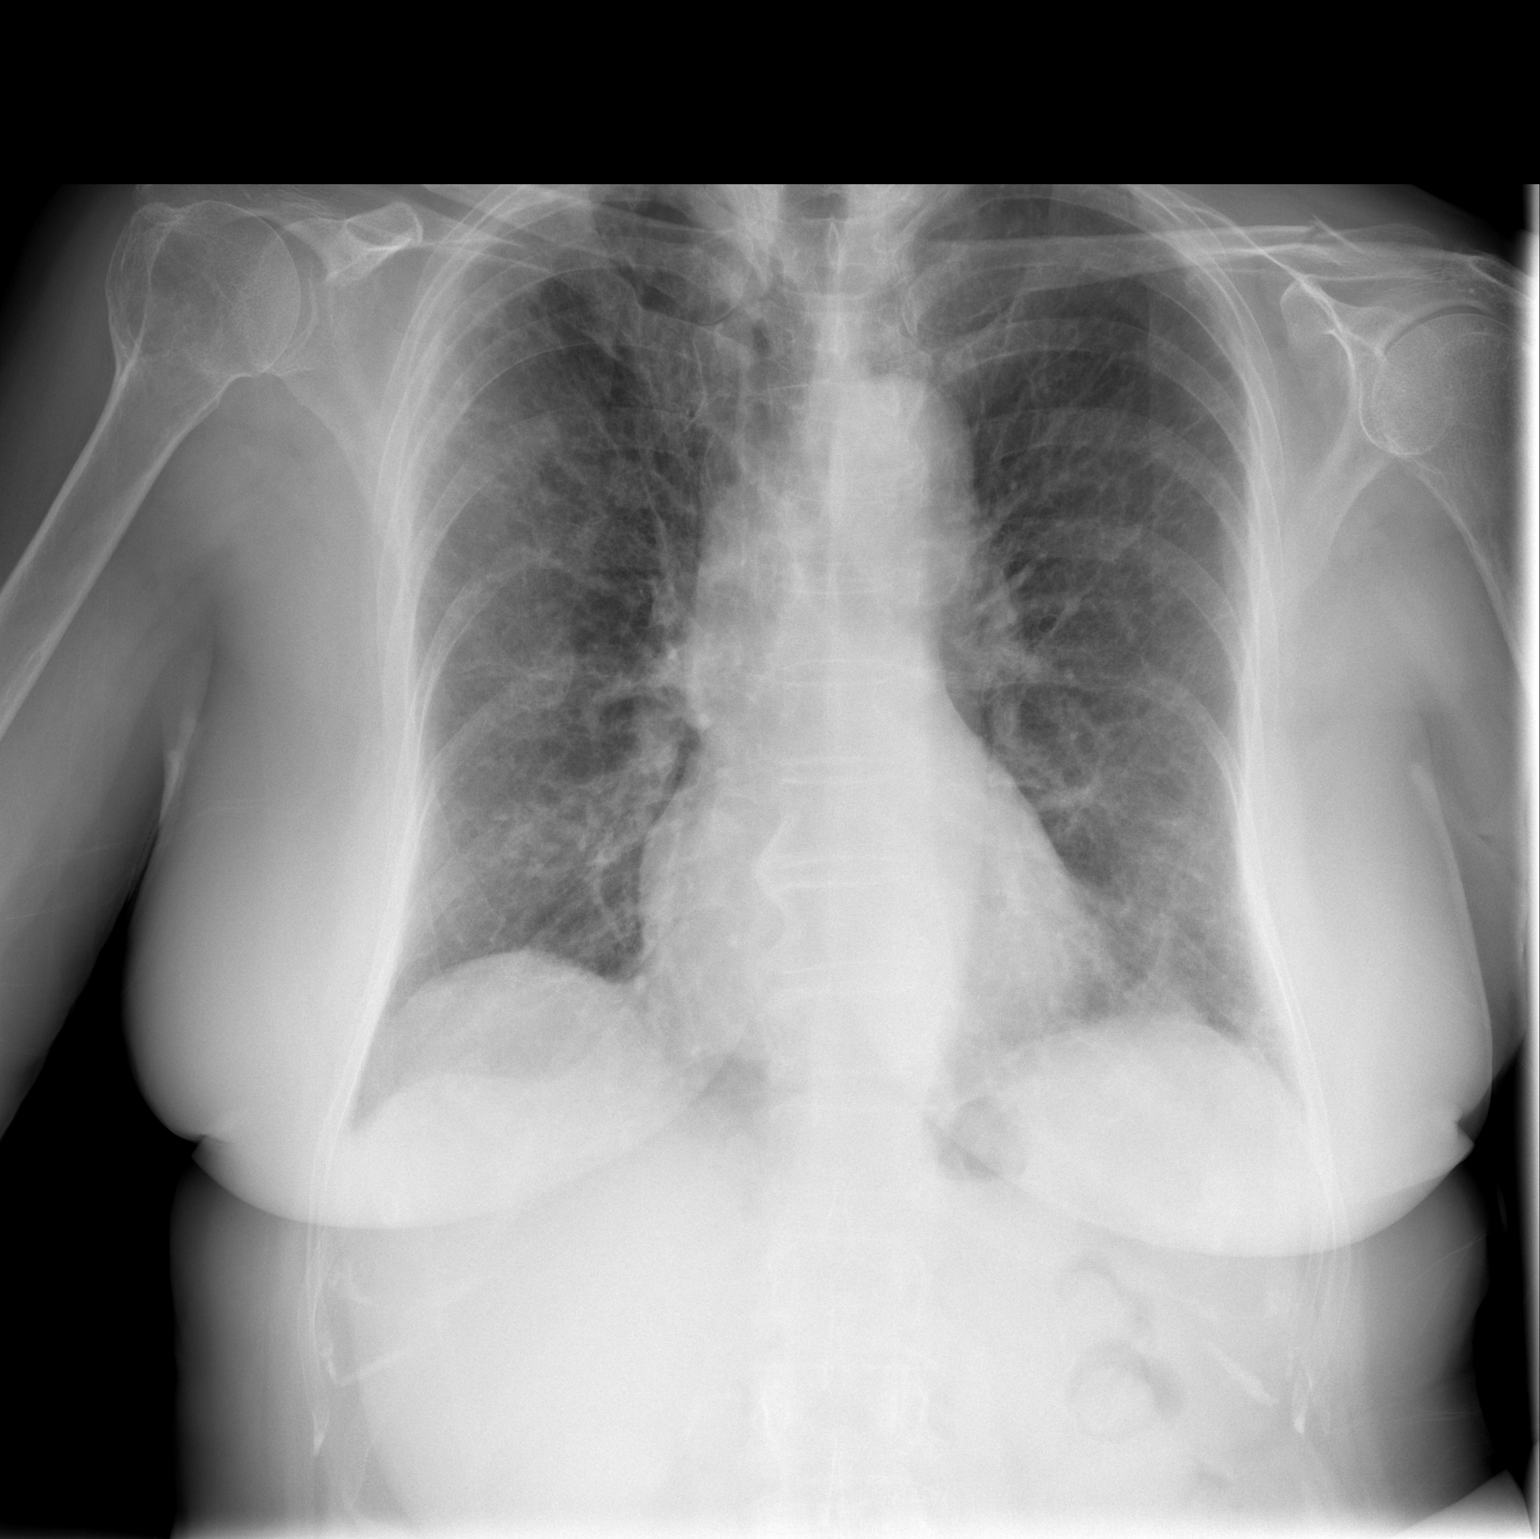

[w chest lat]
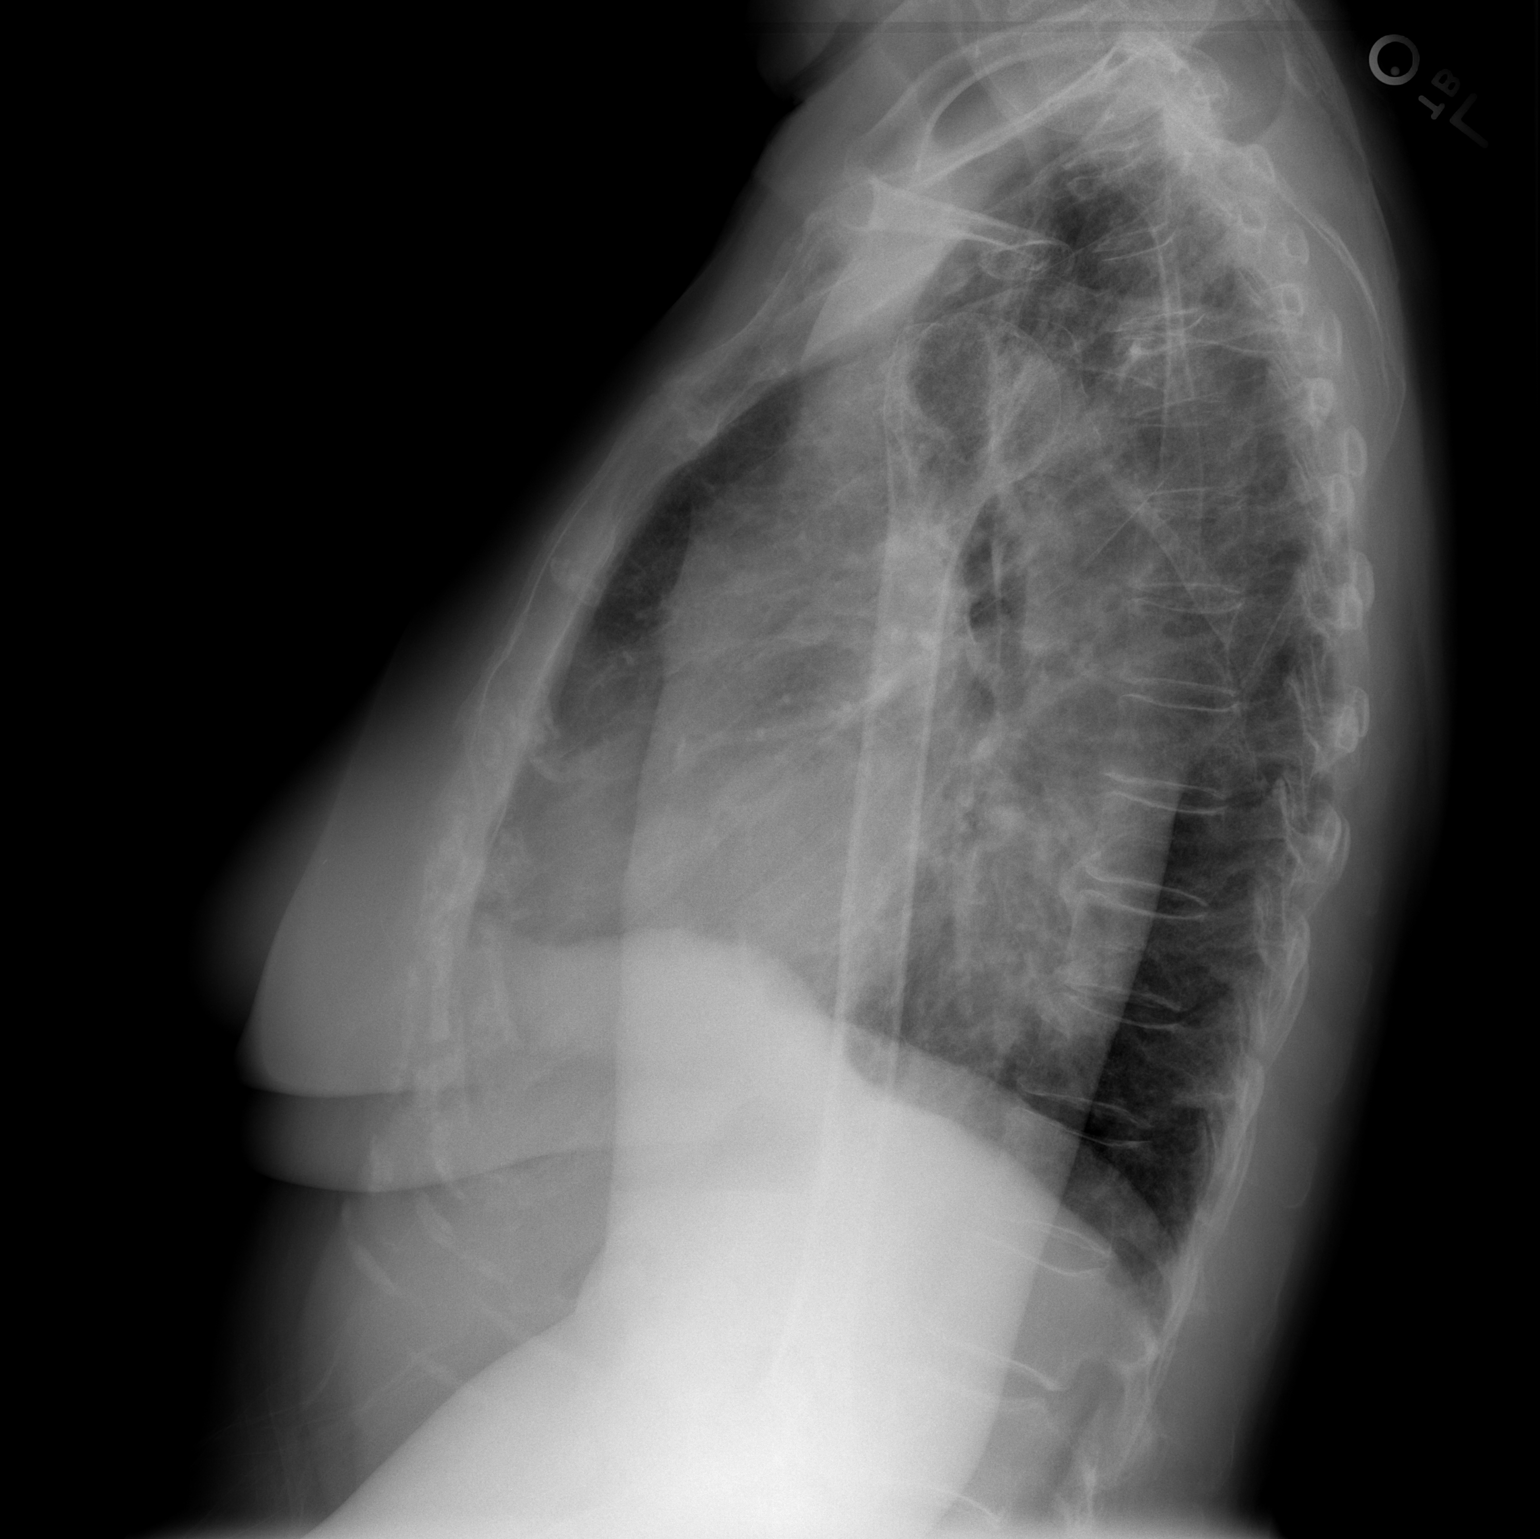

[2 of 2 positions shown; findings below may reference images not displayed]

FINDINGS: The cardiac silhouette, mediastinal and hilar contours
are within normal limits.  There are chronic-appearing bronchitic
type scarring changes but no definite acute overlying pulmonary
process.  No pneumothorax or pleural effusion.  The left distal
clavicle fractures noted.  No obvious rib fractures.  The thoracic
vertebral bodies are normally aligned.
IMPRESSION: 1.  Chronic appearing scarring type changes without acute overlying
pulmonary process.
2.  Distal clavicle fracture on the left.

## 2015-11-07 LAB — POCT INR: INR: 0.9 (ref 0.9–1.1)

## 2015-11-09 HISTORY — PX: CARDIAC CATHETERIZATION: SHX172

## 2016-12-28 LAB — HEPATIC FUNCTION PANEL
ALT: 26 (ref 7–35)
AST: 24 (ref 13–35)
Alkaline Phosphatase: 57 (ref 25–125)
BILIRUBIN, TOTAL: 0.3

## 2016-12-30 LAB — BASIC METABOLIC PANEL
BUN: 13 (ref 4–21)
CREATININE: 0.8 (ref 0.5–1.1)
Glucose: 124
Potassium: 4 (ref 3.4–5.3)
Sodium: 139 (ref 137–147)

## 2016-12-30 LAB — CBC AND DIFFERENTIAL
HCT: 30 — AB (ref 36–46)
Hemoglobin: 9.7 — AB (ref 12.0–16.0)
PLATELETS: 173 (ref 150–399)
WBC: 7.8

## 2017-01-04 ENCOUNTER — Encounter: Payer: Self-pay | Admitting: Internal Medicine

## 2017-01-04 NOTE — Progress Notes (Signed)
    NURSING HOME LOCATION:  [facility] ROOM NUMBER:  [id number]  CODE STATUS:  Full Code  PCP:   No primary care provider on file.    This is a comprehensive admission note to [facility] performed on this date less than 30 days from date of admission. Included are preadmission medical/surgical history;reconciled medication list; family history; social history and comprehensive review of systems.  Corrections and additions to the records were documented . Comprehensive physical exam was also performed. Additionally a clinical summary was entered for each active diagnosis pertinent to this admission in the Problem List to enhance continuity of care.  HPI:  Past medical and surgical history:  Social history:  Family history:  Review of systems:Could not be completed due to dementia. Date given as Constitutional: No fever,significant weight change, fatigue  Eyes: No redness, discharge, pain, vision change ENT/mouth: No nasal congestion,  purulent discharge, earache,change in hearing ,sore throat  Cardiovascular: No chest pain, palpitations,paroxysmal nocturnal dyspnea, claudication, edema  Respiratory: No cough, sputum production,hemoptysis, DOE , significant snoring,apnea  Gastrointestinal: No heartburn,dysphagia,abdominal pain, nausea / vomiting,rectal bleeding, melena,change in bowels Genitourinary: No dysuria,hematuria, pyuria,  incontinence, nocturia Musculoskeletal: No joint stiffness, joint swelling, weakness,pain Dermatologic: No rash, pruritus, change in appearance of skin Neurologic: No dizziness,headache,syncope, seizures, numbness , tingling Psychiatric: No significant anxiety , depression, insomnia, anorexia Endocrine: No change in hair/skin/ nails, excessive thirst, excessive hunger, excessive urination  Hematologic/lymphatic: No significant bruising, lymphadenopathy,abnormal bleeding Allergy/immunology: No itchy/ watery eyes, significant sneezing, urticaria,  angioedema  Physical exam:  Pertinent or positive findings: General appearance:Adequately nourished; no acute distress , increased work of breathing is present.   Lymphatic: No lymphadenopathy about the head, neck, axilla . Eyes: No conjunctival inflammation or lid edema is present. There is no scleral icterus. Ears:  External ear exam shows no significant lesions or deformities.   Nose:  External nasal examination shows no deformity or inflammation. Nasal mucosa are pink and moist without lesions ,exudates Oral exam: lips and gums are healthy appearing.There is no oropharyngeal erythema or exudate . Neck:  No thyromegaly, masses, tenderness noted.    Heart:  Normal rate and regular rhythm. S1 and S2 normal without gallop, murmur, click, rub .  Lungs:Chest clear to auscultation without wheezes, rhonchi,rales , rubs. Abdomen:Bowel sounds are normal. Abdomen is soft and nontender with no organomegaly, hernias,masses. GU: deferred  Extremities:  No cyanosis, clubbing,edema  Neurologic exam : Strength equal  in upper & lower extremities Balance,Rhomberg,finger to nose testing could not be completed due to clinical state Deep tendon reflexes are equal Skin: Warm & dry w/o tenting. No significant lesions or rash.  See clinical summary under each active problem in the Problem List with associated updated therapeutic plan   This encounter was created in error - please disregard.

## 2017-01-07 ENCOUNTER — Encounter: Payer: Self-pay | Admitting: Internal Medicine

## 2017-01-07 ENCOUNTER — Non-Acute Institutional Stay (SKILLED_NURSING_FACILITY): Payer: Medicare Other | Admitting: Internal Medicine

## 2017-01-07 DIAGNOSIS — J8410 Pulmonary fibrosis, unspecified: Secondary | ICD-10-CM | POA: Diagnosis not present

## 2017-01-07 DIAGNOSIS — S72002A Fracture of unspecified part of neck of left femur, initial encounter for closed fracture: Secondary | ICD-10-CM | POA: Insufficient documentation

## 2017-01-07 DIAGNOSIS — F339 Major depressive disorder, recurrent, unspecified: Secondary | ICD-10-CM

## 2017-01-07 DIAGNOSIS — D649 Anemia, unspecified: Secondary | ICD-10-CM

## 2017-01-07 DIAGNOSIS — S72002D Fracture of unspecified part of neck of left femur, subsequent encounter for closed fracture with routine healing: Secondary | ICD-10-CM | POA: Diagnosis not present

## 2017-01-07 NOTE — Progress Notes (Signed)
NURSING HOME LOCATION:  [facility] ROOM NUMBER:  [id number]  CODE STATUS:  Full Code  PCP:  Joetta MannersHeather Spry, MD , [place]  This is a comprehensive admission note to [facility] performed on this date less than 30 days from date of admission. Included are preadmission medical/surgical history;reconciled medication list; family history; social history and comprehensive review of systems.  Corrections and additions to the records were documented . Comprehensive physical exam was also performed. Additionally a clinical summary was entered for each active diagnosis pertinent to this admission in the Problem List to enhance continuity of care.  HPI: The patient was [facility] with a mildly comminuted closed left hip fracture , sustained in a mechanical fall without neuro or cardiac prodrome. Open reduction & internal fixation was completed 12/29/16 by Dr. Garner Nashichard Schultz. She was on a PCA pump postop for pain. This was associated with mild hypotension for which she received IV fluids; blood pressure medicines were held. PT/OT and pneumatic compression devices were employed. Perioperatively she developed anemia with hemoglobin 9.7 & hematocrit 29.9 . The calcium was 7.9 and glucose 124.  Past medical and surgical history: Includes a history of pulmonary fibrosis with recurrent spontaneous pneumothorax 3, the last in 1967. She is on statins for dyslipidemia. She also is on a PPI and sucrafate for GERD. Other diagnoses include anxiety, hypertension, osteoporosis, vitamin D deficiency, and seasonal allergies. Surgeries include bilateral cataract extraction, left elbow surgery (left distal humeral lateral condyle fracture, Dr. Merlyn LotKuzma), and coronary angiography in 2017. She also had right subtrochanteric femur fracture with open reduction in 2003.  Social history: Former pack per day smoker intermittently over 40 year period. No alcohol use.  Family  history: Updated  Review of systems: Her only complaints are weakness of a generalized nature, chronic balance problems and depression. Her son believes this is related to the death of his brother from throat cancer. Constitutional: No fever,significant weight change, fatigue  Eyes: No redness, discharge, pain, vision change ENT/mouth: No nasal congestion,  purulent discharge, earache,change in hearing ,sore throat  Cardiovascular: No chest pain, palpitations,paroxysmal nocturnal dyspnea, claudication, edema  Respiratory: No cough, sputum production,hemoptysis, DOE , significant snoring,apnea  Gastrointestinal: No heartburn,dysphagia,abdominal pain, nausea / vomiting,rectal bleeding, melena,change in bowels Genitourinary: No dysuria,hematuria, pyuria,  incontinence, nocturia Musculoskeletal: No joint stiffness, joint swelling Dermatologic: No rash, pruritus, change in appearance of skin Neurologic: No dizziness,headache,syncope, seizures, numbness , tingling Psychiatric: No insomnia, anorexia Endocrine: No change in hair/skin/ nails, excessive thirst, excessive hunger, excessive urination  Hematologic/lymphatic: No significant bruising, lymphadenopathy,abnormal bleeding Allergy/immunology: No itchy/ watery eyes, significant sneezing, urticaria, angioedema  Physical exam:  Pertinent or positive findings: She is alert and oriented. Eyebrows are painted. She has minor bilateral rales. Pedal pulses are decreased. She exhibits generalized weakness, especially in the left lower extremity.  General appearance:Adequately nourished; no acute distress , increased work of breathing is present.   Lymphatic: No lymphadenopathy about the head, neck, axilla . Eyes: No conjunctival inflammation or lid edema is present. There is no scleral icterus. Ears:  External ear exam shows no significant lesions or deformities.   Nose:  External nasal examination shows no deformity or inflammation. Nasal mucosa are pink  and moist without lesions ,exudates Oral exam: lips and gums are healthy appearing.There is no oropharyngeal erythema or exudate . Neck:  No thyromegaly, masses, tenderness noted.    Heart:  Normal rate and regular rhythm. S1 and S2 normal without gallop, murmur,  click, rub .  Lungs:without wheezes, rhonchi,rubs. Abdomen:Bowel sounds are normal. Abdomen is soft and nontender with no organomegaly, hernias,masses. GU: deferred  Extremities:  No cyanosis, clubbing,edema  Neurologic exam : Balance,Rhomberg,finger to nose testing could not be completed due to clinical state Deep tendon reflexes are equal Skin: Warm & dry w/o tenting. No significant lesions or rash.  See clinical summary under each active problem in the Problem List with associated updated therapeutic plan

## 2017-01-07 NOTE — Assessment & Plan Note (Addendum)
Pulmonary follow-up post discharge

## 2017-01-07 NOTE — Patient Instructions (Signed)
See assessment and plan under each diagnosis in the problem list and acutely for this visit 

## 2017-01-07 NOTE — Assessment & Plan Note (Addendum)
PT/OT @ SNF F/U as per Dr Tomasa BlaseSchultz

## 2017-01-07 NOTE — Assessment & Plan Note (Signed)
Continue present anti-depression medications  Follow-up with Dr. Carolyne FiscalSpry at discharge

## 2017-01-07 NOTE — Assessment & Plan Note (Signed)
Recheck H&H due to weakness

## 2017-01-08 LAB — CBC AND DIFFERENTIAL
HCT: 29 — AB (ref 36–46)
HEMOGLOBIN: 9.7 — AB (ref 12.0–16.0)
Neutrophils Absolute: 7
Platelets: 489 — AB (ref 150–399)
WBC: 12.7

## 2017-01-17 ENCOUNTER — Encounter: Payer: Self-pay | Admitting: Internal Medicine

## 2017-01-17 ENCOUNTER — Non-Acute Institutional Stay (SKILLED_NURSING_FACILITY): Payer: Medicare Other | Admitting: Internal Medicine

## 2017-01-17 DIAGNOSIS — M79652 Pain in left thigh: Secondary | ICD-10-CM | POA: Diagnosis not present

## 2017-01-17 NOTE — Patient Instructions (Signed)
See assessment and plan under diagnosis acutely for this visit  

## 2017-01-17 NOTE — Progress Notes (Signed)
    NURSING HOME LOCATION:  [facility] ROOM NUMBER:  [id number]  CODE STATUS:  Full Code  PCP: Joetta MannersHeather Spry MD, [place]  This is a nursing facility follow up for specific acute issue of acute left thigh pain.  Interim medical record and care since [facility] was updated with review of diagnostic studies and change in clinical status since last visit were documented.  HPI: She underwent ORIF of L hip 7/7 for mildly comminuted hip fracture by Dr Gar[facility]ard [facility]. Last evening 7/25 at approximately 6 PM the patient was standing in the bathroom. She she lost her balance and planted her feet while pivoting attempting to sit back in wheelchair. She experienced acute pain in the left mid thigh. She states that she fell back into the wheelchair. Other than the sudden change in position of the legs there was no other trigger for the injury. The pain lasted approximately an hour and was treated with Tiger balm topically. Today she's had very localized left mid thigh pain when she stands and pivots the left foot or flexes the left knee. She has difficulty describing the pain other than "sickening". It lasts seconds and is not radiating. Pain is not associated with any other neuromuscular process or symptoms. She's had some pre-existing unrelated recurrent hip pain from adjusting her weight while sitting.The LLE was weak when she attempted PT/OT today. .   Review of systems:  Dermatologic: No rash, pruritus, change in appearance of skin Neurologic: No  numbness , tingling, stool/urine incontinence   Physical exam:  Pertinent or positive findings: she has no pain with knee flexion or limited hip rotation. Homans sign is negative and there is no tenderness in the left popliteal space. Some fusiform enlargement of the left knee with small effusion present. There is no change in the color or temperature of  skin in the area of positional discomfort. There is no pain with deep  palpation in this area.   General appearance:Adequately nourished; no acute distress , increased work of breathing is present.   Extremities:  No cyanosis, clubbing,edema  Neurologic exam : Strength equal  in  lower extremities Balance,Rhomberg,finger to nose testing could not be completed due to clinical state Skin: Warm & dry w/o tenting. No significant lesions or rash.  #1 positional left thigh pain post acute pivot while standing; improving by history Plan topical anti-inflammatory agent Orthopedic reassessment if no better 7/27. She wished to defer assessment until tomorrow.

## 2017-01-18 ENCOUNTER — Encounter: Payer: Self-pay | Admitting: Internal Medicine
